# Patient Record
Sex: Male | Born: 1980 | Race: Black or African American | Hispanic: No | Marital: Married | Smoking: Current every day smoker
Health system: Southern US, Community
[De-identification: ages and names within clinical notes are randomized; demographics above are authoritative.]

## PROBLEM LIST (undated history)

## (undated) DIAGNOSIS — F329 Major depressive disorder, single episode, unspecified: Secondary | ICD-10-CM

## (undated) DIAGNOSIS — F32A Depression, unspecified: Secondary | ICD-10-CM

## (undated) DIAGNOSIS — I10 Essential (primary) hypertension: Secondary | ICD-10-CM

## (undated) DIAGNOSIS — E7800 Pure hypercholesterolemia, unspecified: Secondary | ICD-10-CM

---

## 2016-04-08 ENCOUNTER — Emergency Department (HOSPITAL_BASED_OUTPATIENT_CLINIC_OR_DEPARTMENT_OTHER)
Admission: EM | Admit: 2016-04-08 | Discharge: 2016-04-08 | Disposition: A | Discharge status: Home / Self Care | Payer: BC Managed Care – PPO | Attending: Emergency Medicine | Admitting: Emergency Medicine

## 2016-04-08 ENCOUNTER — Encounter (HOSPITAL_BASED_OUTPATIENT_CLINIC_OR_DEPARTMENT_OTHER): Payer: Self-pay | Admitting: *Deleted

## 2016-04-08 ENCOUNTER — Telehealth (HOSPITAL_BASED_OUTPATIENT_CLINIC_OR_DEPARTMENT_OTHER): Payer: Self-pay | Admitting: Emergency Medicine

## 2016-04-08 ENCOUNTER — Emergency Department (HOSPITAL_BASED_OUTPATIENT_CLINIC_OR_DEPARTMENT_OTHER)
Admission: EM | Admit: 2016-04-08 | Discharge: 2016-04-08 | Disposition: A | Discharge status: Home / Self Care | Payer: BC Managed Care – PPO | Source: Home / Self Care

## 2016-04-08 ENCOUNTER — Encounter (HOSPITAL_BASED_OUTPATIENT_CLINIC_OR_DEPARTMENT_OTHER): Payer: Self-pay | Admitting: Emergency Medicine

## 2016-04-08 DIAGNOSIS — E7800 Pure hypercholesterolemia, unspecified: Secondary | ICD-10-CM

## 2016-04-08 DIAGNOSIS — I10 Essential (primary) hypertension: Secondary | ICD-10-CM | POA: Insufficient documentation

## 2016-04-08 DIAGNOSIS — R066 Hiccough: Secondary | ICD-10-CM | POA: Insufficient documentation

## 2016-04-08 DIAGNOSIS — F329 Major depressive disorder, single episode, unspecified: Secondary | ICD-10-CM

## 2016-04-08 DIAGNOSIS — F17200 Nicotine dependence, unspecified, uncomplicated: Secondary | ICD-10-CM

## 2016-04-08 DIAGNOSIS — Z5321 Procedure and treatment not carried out due to patient leaving prior to being seen by health care provider: Secondary | ICD-10-CM

## 2016-04-08 DIAGNOSIS — Z79899 Other long term (current) drug therapy: Secondary | ICD-10-CM | POA: Insufficient documentation

## 2016-04-08 HISTORY — DX: Pure hypercholesterolemia, unspecified: E78.00

## 2016-04-08 HISTORY — DX: Depression, unspecified: F32.A

## 2016-04-08 HISTORY — DX: Major depressive disorder, single episode, unspecified: F32.9

## 2016-04-08 HISTORY — DX: Essential (primary) hypertension: I10

## 2016-04-08 MED ORDER — SODIUM CHLORIDE 0.9 % IV SOLN
25.0000 mg | Freq: Once | INTRAVENOUS | Status: DC
  Filled 2016-04-08: qty 1

## 2016-04-08 MED ORDER — CHLORPROMAZINE HCL 25 MG PO TABS
25.0000 mg | ORAL_TABLET | Freq: Once | ORAL | Status: AC
  Administered 2016-04-08: 25 mg via ORAL
  Filled 2016-04-08: qty 1

## 2016-04-08 MED ORDER — CHLORPROMAZINE HCL 25 MG PO TABS: 25.0000 mg | ORAL_TABLET | Freq: Three times a day (TID) | ORAL | Status: AC | PRN

## 2016-04-08 MED ORDER — METOCLOPRAMIDE HCL 10 MG PO TABS: 10.0000 mg | ORAL_TABLET | Freq: Four times a day (QID) | ORAL | Status: AC | PRN

## 2016-04-08 NOTE — ED Notes (Signed)
Pt has had hiccups for 3 days.  Pt was treated for a sinus infection with an antibiotic and he states that the sinus infection is clearing up but the hiccups remain. This occurred once before (sinus infection-antibiotics for sinus infection-hiccups) and he was given a muscle relaxer which relieved his hiccups.  Pt was given a muscle relaxer without any relief this time (flexeril)

## 2016-04-08 NOTE — Discharge Instructions (Signed)
Return for worsening symptoms, including vomiting and unable to keep down food/fluids, fever, severe abdominal or chest pain, or any other symptoms concerning to you.  Please follow-up closely with your primary care doctor for ongoing management.  Hiccups A hiccup is the result of a sudden shortening of the muscle below your lungs (diaphragm). This movement of your diaphragm causes a sudden inhalation followed by the closing of your vocal cords, which causes the hiccup sound. Most people get the hiccups. Typically, hiccups last only a short amount of time.  There are three types of hiccups:   Benign. These hiccups last less than 48 hours.   Persistent. These hiccups last more than 48 hours, but less than 1 month.   Intractable. These hiccups last more than 1 month.  A hiccup is a reflex. You cannot control reflexes.  HOME CARE INSTRUCTIONS  Watch your hiccups for any changes. The following actions may help to lessen any discomfort that you are feeling:  Eat small meals.   Limit alcohol intake to no more than 1 drink per day for nonpregnant women and 2 drinks per day for men. One drink equals 12 oz of beer, 5 oz of wine, or 1 oz of hard liquor.  Limit drinking carbonated or fizzy drinks, such as soda.  Eat and chew your food slowly.   Avoid eating or drinking hot or spicy foods and drinks.  Take medicines only as directed by your health care provider.  SEEK MEDICAL CARE IF:    Your hiccups do not improve with treatment.  You cannot sleep or eat due to the hiccups.   You have unexpected weight loss due to the hiccups.   You have a fever.   You have trouble breathing or swallowing.   You develop severe pain in your abdomen.  You develop numbness, tingling, or weakness.   This information is not intended to replace advice given to you by your health care provider. Make sure you discuss any questions you have with your health care provider.   Document Released:  12/15/2001 Document Revised: 02/20/2015 Document Reviewed: 10/02/2014 Elsevier Interactive Patient Education [employer]

## 2016-04-08 NOTE — ED Notes (Signed)
Patient called x 3  - no longer visualized in the waiting room

## 2016-04-08 NOTE — ED Notes (Signed)
Patient was here this am for the same. They have not stopped. The patient states that nothing is working

## 2016-04-08 NOTE — ED Provider Notes (Signed)
CSN: [id number]     Arrival date & time 04/08/16  [id number] History   First MD Initiated Contact with Patient 04/08/16 [phone]     Chief Complaint  Patient presents with  . Hiccups     (Consider location/radiation/quality/duration/timing/severity/associated sxs/prior Treatment) HPI 35 year old male who presents with hiccups. He states that less than one week ago he was diagnosed with sinusitis, received a steroid shot and placed on Augmentin. Was out of town at that time, but developed intractable hiccups over the past 3 days. States that he developed hiccups after treatment for sinusitis about 6 years ago, that resolved with muscle relaxants. He was prescribed Flexeril and placed on amoxicillin yesterday by a primary care provider. States that symptoms did not improve. No fevers, confusion, numbness or weakness, difficulty swallowing, vision or speech changes, nausea or vomiting, abdominal pain, chest pain or difficulty breathing. Past Medical History  Diagnosis Date  . Hypertension   . High cholesterol   . Depression    History reviewed. No pertinent past surgical history. History reviewed. No pertinent family history. Social History  Substance Use Topics  . Smoking status: Current Every Day Smoker -- 0.50 packs/day  . Smokeless tobacco: None  . Alcohol Use: Yes     Comment: socially    Review of Systems 10/14 systems reviewed and are negative other than those stated in the HPI    Allergies  Review of patient's allergies indicates no known allergies.  Home Medications   Prior to Admission medications   Medication Sig Start Date End Date Taking? Authorizing Provider  amoxicillin (AMOXIL) 875 MG tablet Take 875 mg by mouth 2 (two) times daily.   Yes Historical Provider, MD  cyclobenzaprine (FLEXERIL) 5 MG tablet Take 5 mg by mouth 3 (three) times daily as needed for muscle spasms (hiccups).   Yes Historical Provider, MD  guaiFENesin-codeine (ROBITUSSIN AC) 100-10 MG/5ML syrup Take 5  mLs by mouth 3 (three) times daily as needed for cough.   Yes Historical Provider, MD  chlorproMAZINE (THORAZINE) 25 MG tablet Take 1 tablet (25 mg total) by mouth 3 (three) times daily as needed for hiccoughs. 04/08/16   Lavera Guiseana Duo Shye Doty, MD   BP 123/86 mmHg  Pulse 67  Temp(Src) 98.6 F (37 C) (Oral)  Resp 18  Ht 5\' 11"  (1.803 m)  Wt 240 lb (108.863 kg)  BMI 33.49 kg/m2  SpO2 99% Physical Exam Physical Exam  Nursing note and vitals reviewed. Constitutional: Well developed, well nourished, non-toxic, and in no acute distress Head: Normocephalic and atraumatic.  Mouth/Throat: Oropharynx is clear and moist.  Neck: Normal range of motion. Neck supple.  Cardiovascular: Normal rate and regular rhythm.   Pulmonary/Chest: Effort normal and breath sounds normal.  Abdominal: Soft. There is no tenderness. There is no rebound and no guarding.  Musculoskeletal: Normal range of motion.  Neurological: Alert, no facial droop, fluent speech, moves all extremities symmetrically Skin: Skin is warm and dry.  Psychiatric: Cooperative  ED Course  Procedures (including critical care time) Labs Review Labs Reviewed - No data to display  Imaging Review No results found. I have personally reviewed and evaluated these images and lab results as part of my medical decision-making.   EKG Interpretation None      MDM   Final diagnoses:  Hiccups    With intractable hiccups after recent treatment for sinusitis, not resolved with flexeril. Otherwise well appearing. Exam otherwise unremarkable. Will treat with thorazine. Strict return and follow-up instructions reviewed. He expressed understanding of all  discharge instructions and felt comfortable with the plan of care.     Lavera Guise, MD 04/08/16 1053

## 2018-04-29 ENCOUNTER — Ambulatory Visit: Payer: BC Managed Care – PPO | Admitting: Podiatry

## 2018-04-29 ENCOUNTER — Encounter: Payer: Self-pay | Admitting: Podiatry

## 2018-04-29 VITALS — BP 136/84 | HR 90 | Ht 71.0 in | Wt 245.0 lb

## 2018-04-29 DIAGNOSIS — B353 Tinea pedis: Secondary | ICD-10-CM | POA: Diagnosis not present

## 2018-04-29 DIAGNOSIS — L853 Xerosis cutis: Secondary | ICD-10-CM | POA: Diagnosis not present

## 2018-04-29 NOTE — Patient Instructions (Signed)
Seen for dry peeling skin on both feet. Reviewed findings and available treatment options, open toed shoes, more aeration of feet, using Salsun blue shampoo scrub at the end of each shower. Return as needed.

## 2018-04-29 NOTE — Progress Notes (Signed)
SUBJECTIVE: 37 y.o. year old male presents for complaining of blistering bumps that comes and goes including in between digits. Peeling problem was for years, and bump problem was for the last few years. On feet x 6 hours a day. Does exercise off and on. Too busy now due to kids age of 69, 383, 35 years old.  Review of Systems  Constitutional: Negative.   HENT: Negative.   Eyes: Negative.   Respiratory: Negative.   Cardiovascular: Negative.   Gastrointestinal:       Using medication for Acid reflux.  Genitourinary: Negative.   Musculoskeletal: Negative.   Skin: Positive for itching and rash.     OBJECTIVE: DERMATOLOGIC EXAMINATION: Dry peeling skin plantar both feet.  VASCULAR EXAMINATION OF LOWER LIMBS: All pedal pulses are palpable with normal pulsation.  Capillary Filling times within 3 seconds in all digits.  No edema or erythema noted. Temperature gradient from tibial crest to dorsum of foot is within normal bilateral.  NEUROLOGIC EXAMINATION OF THE LOWER LIMBS: All epicritic and tactile sensations grossly intact. Sharp and Dull discriminatory sensations at the plantar ball of hallux is intact bilateral.   MUSCULOSKELETAL EXAMINATION: No gross deformities.  ASSESSMENT: Tinea pedis bilateral foot. Xerotic skin bilateral.  PLAN: Reviewed findings and available treatment options, open toed shoes, more aeration of feet, using Salsun blue shampoo scrub at the end of each shower. Return as needed.

## 2019-04-20 ENCOUNTER — Other Ambulatory Visit: Payer: Self-pay | Admitting: *Deleted

## 2019-04-20 DIAGNOSIS — Z20822 Contact with and (suspected) exposure to covid-19: Secondary | ICD-10-CM

## 2019-04-25 LAB — NOVEL CORONAVIRUS, NAA: SARS-CoV-2, NAA: NOT DETECTED

## 2019-05-02 ENCOUNTER — Telehealth: Payer: Self-pay | Admitting: Internal Medicine

## 2019-05-02 NOTE — Telephone Encounter (Signed)
Let pt know covid test is negative

## 2019-07-12 ENCOUNTER — Other Ambulatory Visit: Payer: Self-pay | Admitting: Internal Medicine

## 2019-07-12 ENCOUNTER — Ambulatory Visit
Admission: RE | Admit: 2019-07-12 | Discharge: 2019-07-12 | Disposition: A | Discharge status: Home / Self Care | Payer: BC Managed Care – PPO | Source: Ambulatory Visit | Attending: Internal Medicine | Admitting: Internal Medicine

## 2019-07-12 DIAGNOSIS — R109 Unspecified abdominal pain: Secondary | ICD-10-CM

## 2019-12-15 ENCOUNTER — Ambulatory Visit: Payer: BC Managed Care – PPO | Attending: Internal Medicine

## 2019-12-15 DIAGNOSIS — Z23 Encounter for immunization: Secondary | ICD-10-CM

## 2019-12-15 NOTE — Progress Notes (Signed)
   Covid-19 Vaccination Clinic  Name:  Thomas Herrera    MRN: [id number] DOB: 1981-04-13  12/15/2019  [name] was observed post Covid-19 immunization for 15 minutes without incidence. He was provided with Vaccine Information Sheet and instruction to access the V-Safe system.   [name] was instructed to call 911 with any severe reactions post vaccine: Marland Kitchen Difficulty breathing  . Swelling of your face and throat  . A fast heartbeat  . A bad rash all over your body  . Dizziness and weakness    Immunizations Administered    Name Date Dose VIS Date Route   Pfizer COVID-19 Vaccine 12/15/2019  9:16 AM 0.3 mL 09/30/2019 Intramuscular   Manufacturer: [employer], [employer]   Lot: [id number]   NDC: [address]-7

## 2020-01-04 ENCOUNTER — Ambulatory Visit: Payer: BC Managed Care – PPO | Attending: Internal Medicine

## 2020-01-04 DIAGNOSIS — Z23 Encounter for immunization: Secondary | ICD-10-CM

## 2020-01-04 NOTE — Progress Notes (Signed)
   Covid-19 Vaccination Clinic  Name:  Thomas Herrera    MRN: [id number] DOB: [birth year]  01/04/2020  [name] was observed post Covid-19 immunization for 15 minutes without incident. He was provided with Vaccine Information Sheet and instruction to access the V-Safe system.   [name] was instructed to call 911 with any severe reactions post vaccine: Marland Kitchen Difficulty breathing  . Swelling of face and throat  . A fast heartbeat  . A bad rash all over body  . Dizziness and weakness   Immunizations Administered    Name Date Dose VIS Date Route   Pfizer COVID-19 Vaccine 01/04/2020 11:40 AM 0.3 mL 09/30/2019 Intramuscular   Manufacturer: [employer], [employer]   Lot: [id number]   NDC: [address]-2

## 2020-03-28 ENCOUNTER — Other Ambulatory Visit: Payer: Self-pay | Admitting: Physician Assistant

## 2020-03-28 DIAGNOSIS — R1013 Epigastric pain: Secondary | ICD-10-CM

## 2020-03-30 ENCOUNTER — Ambulatory Visit
Admission: RE | Admit: 2020-03-30 | Discharge: 2020-03-30 | Disposition: A | Discharge status: Home / Self Care | Payer: BC Managed Care – PPO | Source: Ambulatory Visit | Attending: Physician Assistant | Admitting: Physician Assistant

## 2020-03-30 DIAGNOSIS — R1013 Epigastric pain: Secondary | ICD-10-CM

## 2022-01-27 IMAGING — US US ABDOMEN LIMITED
1 series · 14 of 25 positions shown · non-contrast
Comparison: None.

CLINICAL DATA: Epigastric pain

EXAM:
ULTRASOUND ABDOMEN LIMITED RIGHT UPPER QUADRANT

[Series 1: us abdomen limited · 0.20mm/px · 14 of 72 slices shown]
[im 1/72]
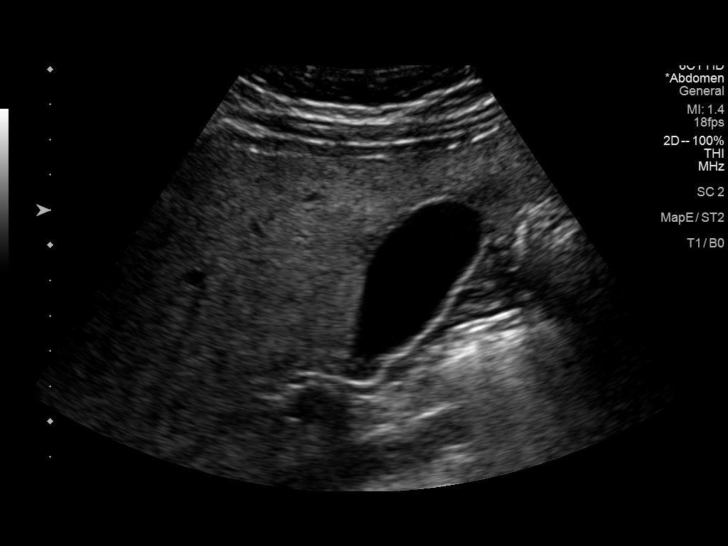
[im 6/72]
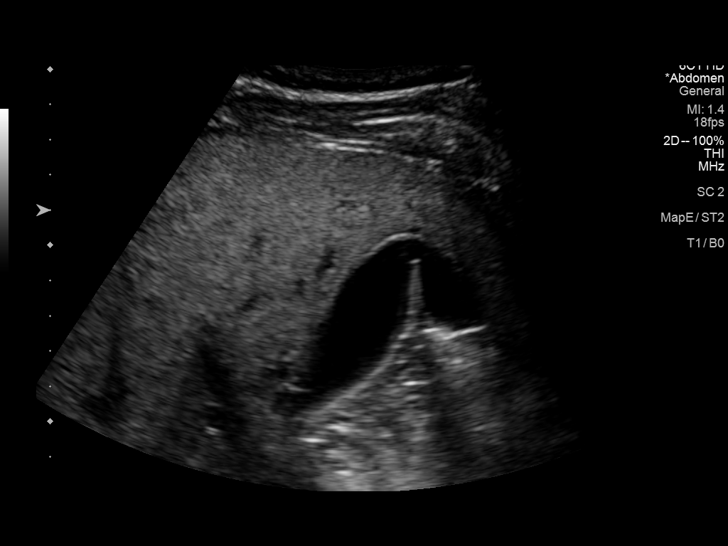
[im 12/72]
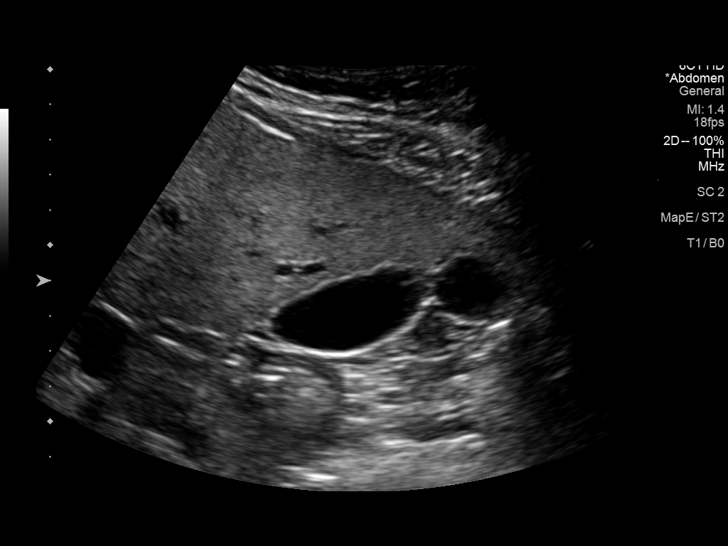
[im 18/72]
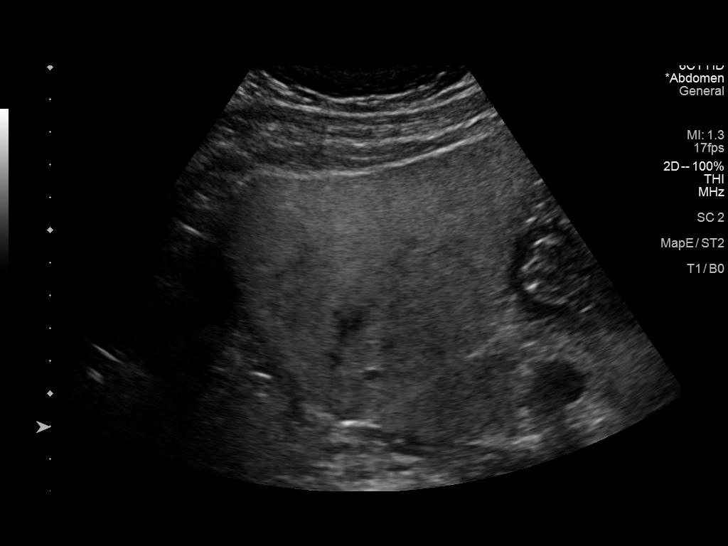
[im 24/72]
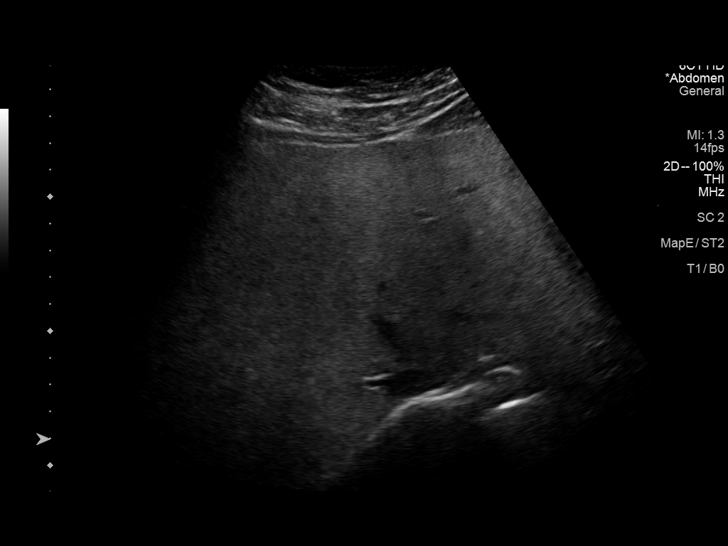
[im 27/72]
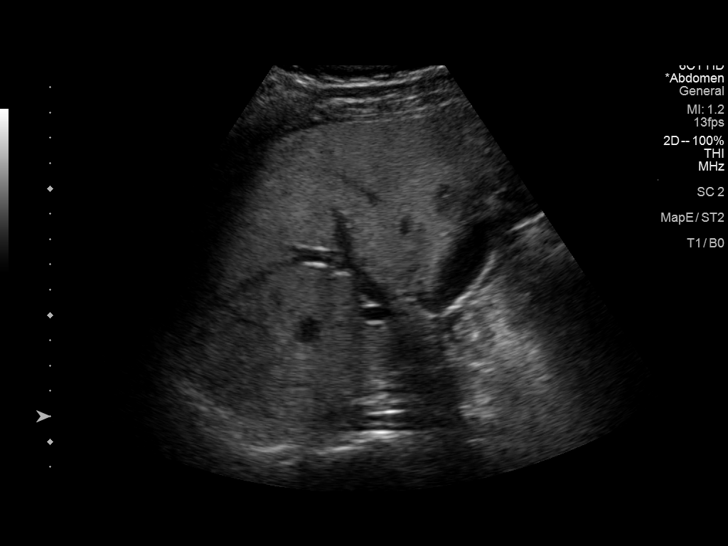
[im 33/72]
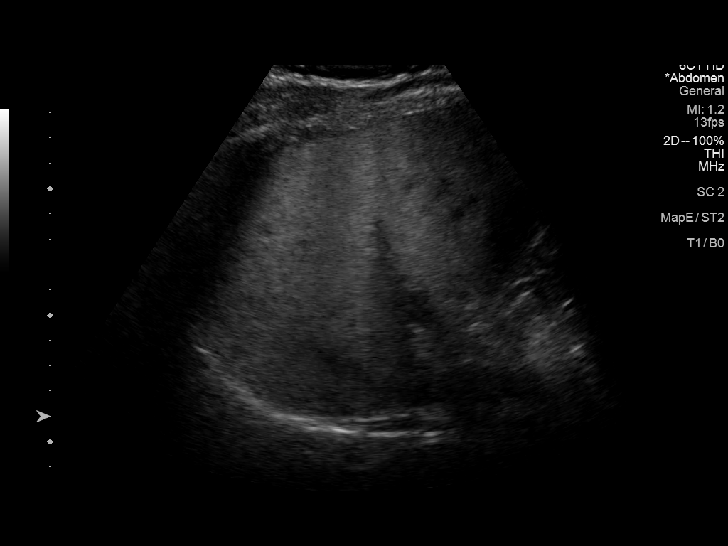
[im 39/72]
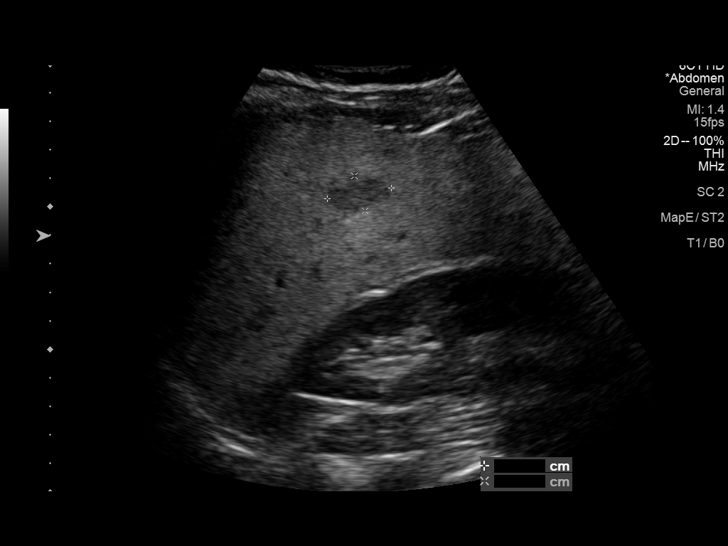
[im 45/72]
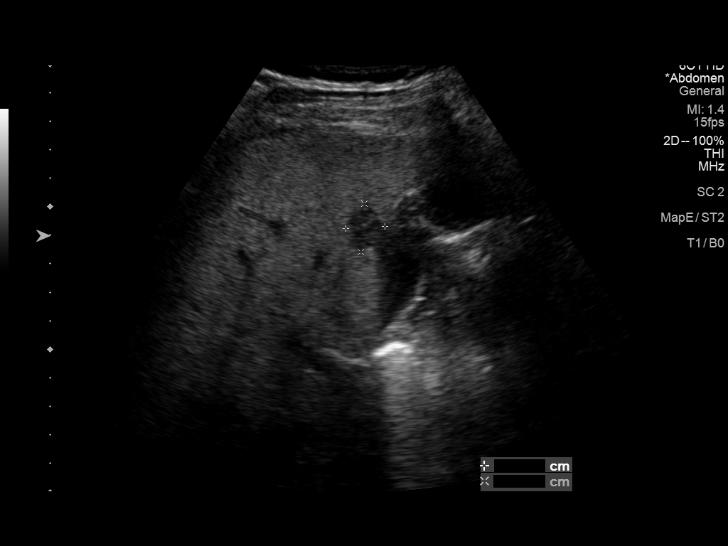
[im 48/72]
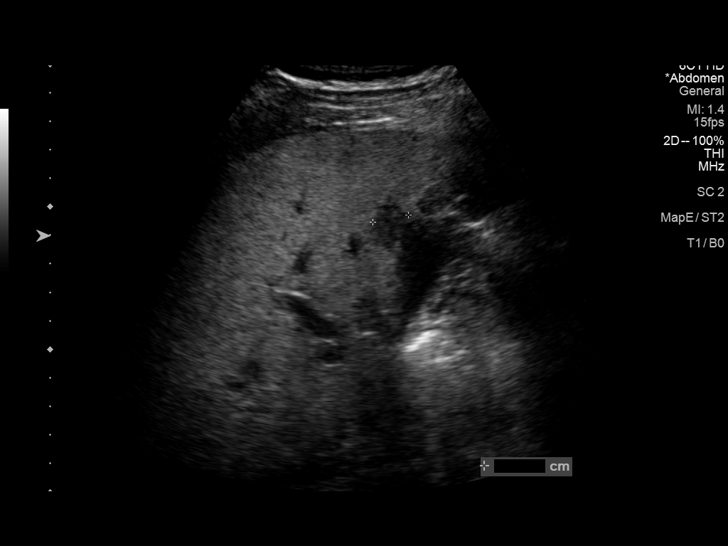
[im 54/72]
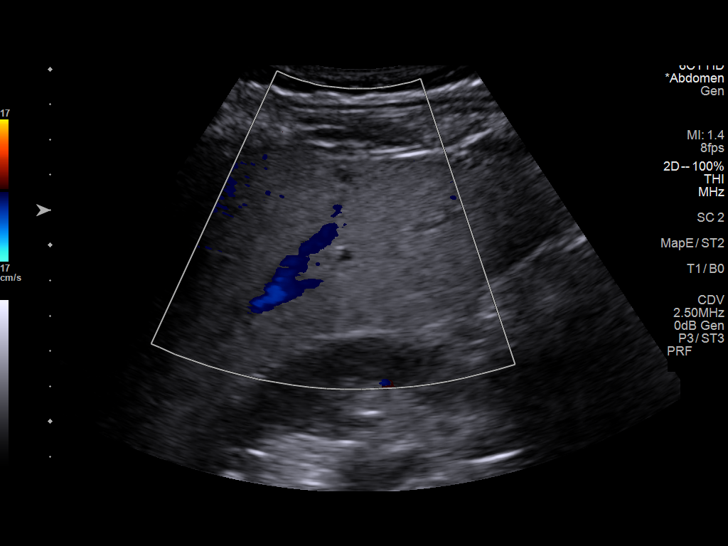
[im 60/72]
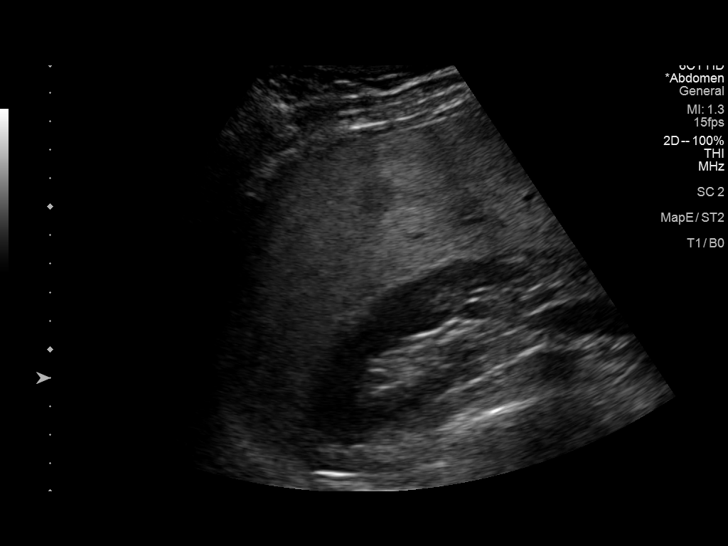
[im 66/72]
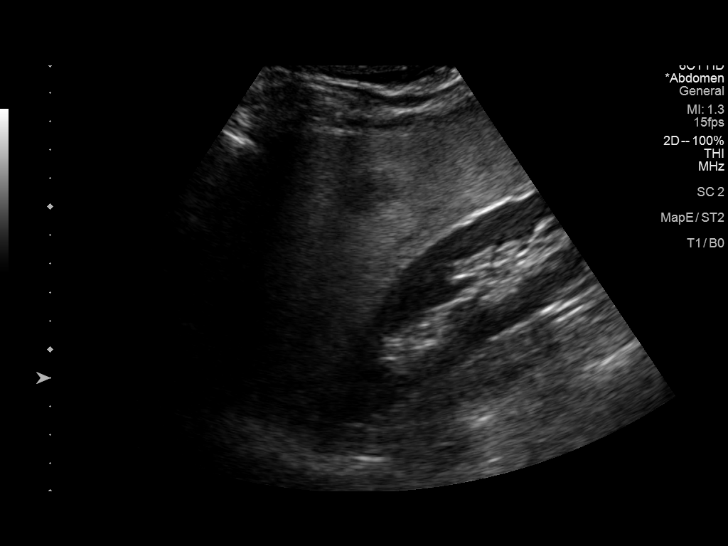
[im 72/72]
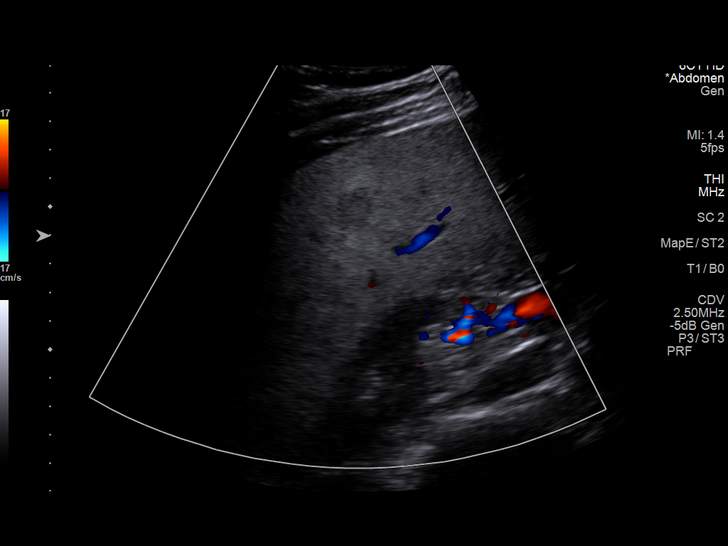

[14 of 25 positions shown; findings below may reference images not displayed]

FINDINGS: Gallbladder:

No gallstones or wall thickening visualized. No sonographic Murphy
sign noted by sonographer.

Common bile duct:

Diameter: Normal caliber, 3-4 mm

Liver:

Increased echotexture compatible with fatty infiltration. Hypoechoic
areas scattered throughout the liver, favor areas of focal fatty
sparing. Portal vein is patent on color Doppler imaging with normal
direction of blood flow towards the liver.

Other: None.
IMPRESSION: Fatty infiltration of the liver with probable areas of focal fatty
sparing.

No cholelithiasis or cholecystitis.
